# Patient Record
Sex: Male | Born: 1955 | Race: White | Hispanic: No | Marital: Married | Smoking: Never smoker
Health system: Southern US, Community
[De-identification: ages and names within clinical notes are randomized; demographics above are authoritative.]

## PROBLEM LIST (undated history)

## (undated) DIAGNOSIS — G43909 Migraine, unspecified, not intractable, without status migrainosus: Secondary | ICD-10-CM

## (undated) DIAGNOSIS — G44009 Cluster headache syndrome, unspecified, not intractable: Secondary | ICD-10-CM

## (undated) DIAGNOSIS — I10 Essential (primary) hypertension: Secondary | ICD-10-CM

## (undated) HISTORY — PX: BACK SURGERY: SHX140

---

## 1999-11-23 ENCOUNTER — Encounter: Admission: RE | Admit: 1999-11-23 | Discharge: 1999-11-23 | Payer: Self-pay | Admitting: Internal Medicine

## 1999-11-23 ENCOUNTER — Encounter: Payer: Self-pay | Admitting: Internal Medicine

## 2003-04-07 ENCOUNTER — Ambulatory Visit (HOSPITAL_COMMUNITY): Admission: AD | Admit: 2003-04-07 | Discharge: 2003-04-08 | Payer: Self-pay | Admitting: Otolaryngology

## 2003-05-21 ENCOUNTER — Ambulatory Visit (HOSPITAL_BASED_OUTPATIENT_CLINIC_OR_DEPARTMENT_OTHER): Admission: RE | Admit: 2003-05-21 | Discharge: 2003-05-21 | Payer: Self-pay | Admitting: Otolaryngology

## 2004-05-20 ENCOUNTER — Ambulatory Visit (HOSPITAL_COMMUNITY): Admission: RE | Admit: 2004-05-20 | Discharge: 2004-05-20 | Payer: Self-pay | Admitting: *Deleted

## 2004-05-20 ENCOUNTER — Encounter (INDEPENDENT_AMBULATORY_CARE_PROVIDER_SITE_OTHER): Payer: Self-pay | Admitting: Specialist

## 2004-08-23 ENCOUNTER — Encounter: Admission: RE | Admit: 2004-08-23 | Discharge: 2004-08-23 | Payer: Self-pay | Admitting: Internal Medicine

## 2007-04-29 ENCOUNTER — Encounter: Admission: RE | Admit: 2007-04-29 | Discharge: 2007-04-29 | Payer: Self-pay | Admitting: Internal Medicine

## 2007-05-23 ENCOUNTER — Encounter: Admission: RE | Admit: 2007-05-23 | Discharge: 2007-05-23 | Payer: Self-pay | Admitting: Family Medicine

## 2007-05-29 ENCOUNTER — Ambulatory Visit (HOSPITAL_COMMUNITY): Admission: RE | Admit: 2007-05-29 | Discharge: 2007-05-30 | Payer: Self-pay | Admitting: Neurosurgery

## 2007-12-05 ENCOUNTER — Inpatient Hospital Stay (HOSPITAL_COMMUNITY): Admission: RE | Admit: 2007-12-05 | Discharge: 2007-12-06 | Payer: Self-pay | Admitting: Neurosurgery

## 2010-11-06 ENCOUNTER — Encounter: Payer: Self-pay | Admitting: Internal Medicine

## 2011-02-28 NOTE — Op Note (Signed)
NAMEJACOREY, William Clark                ACCOUNT NO.:  [id number]   MEDICAL RECORD NO.:  [id number]          PATIENT TYPE:  AMB   LOCATION:  SDS                          FACILITY:  [facility]   PHYSICIAN:  Hewitt Shorts, M.D.DATE OF BIRTH:  Jan 22, 1956   DATE OF PROCEDURE:  05/29/2007  DATE OF DISCHARGE:                               OPERATIVE REPORT   PREOPERATIVE DIAGNOSIS:  C6-7 cervical disk herniations, cervical  spondylosis, cervical degenerative disk disease, and cervical  radiculopathy.   POSTOPERATIVE DIAGNOSIS:  C6-7 cervical disk herniations, cervical  spondylosis, cervical degenerative disk disease, and cervical  radiculopathy.   PROCEDURE:  C6-7 anterior cervical diskectomy and arthrodesis with  allograft and tether cervical plating.   SURGEON:  Hewitt Shorts, M.D.   ASSISTANTS:  Nelia Shi. Webb Silversmith, RN   ANESTHESIA:  General endotracheal.   INDICATION:  The patient is a 55 year old man who presented with left  cervical radiculopathy.  It is found to be due to left C6-7 spondylytic  disk herniation superimposed upon underlying spondylosis and  degenerative disk disease.  The decision was made to proceed with single  level anterior cervical diskectomy and arthrodesis.   PROCEDURE:  The patient was brought to the operating room, placed under  general endotracheal anesthesia.  The patient was placed in 10 pounds of  Holter traction.  The neck was prepped with Betadine soaping solution,  draped in a sterile fashion.  A horizontal incision was made in the left  side of the neck.  The line of the incision was infiltrated with local  anesthetic with epinephrine.  Dissection was carried down to the  subcutaneous tissue.  Bipolar cautery and electrocautery was used to  maintain hemostasis.  Dissection was then carried out to the platysma  and then through an avascular plane leaving the sternocleidomastoid,  carotid artery and jugular vein laterally and the trachea and  esophagus  medially.  The ventral aspect of the vertebral column was identified and  localizing x-ray was taken.  It was difficult to localize because of his  large shoulders and we had to count down from the C4-5 level identifying  C4-5, C5-6 and C6-7.  We then draped the microscope.  It was brought  into the field to provide additional magnification, illumination and  visualization, then decompression was performed using microdissection  and microsurgical technique.  There was large osteophytic overgrowth  that was removed using the XMax drill.  We entered into the disk space.  It was incised with a #15 scalpel.  The diskectomy was performed using a  variety of microcurets and pituitary rongeurs.  The cartilaginous end  plates of the corresponding vertebrae were removed using the XMax drill  and microcurets and then significant posterior osteophytic overgrowth  was removed using the XMax drill and a 2-mm Kerrison punch with a thin  foot plate.  The posterior longitudinal ligament was carefully removed  and we then turned our attention to the neuroforamina.  In the left C6-7  neuroforamina, we did find a free fragment disk herniation.  These  fragments were removed and we were able to  decompress the neuroforamen  bilaterally.  In the end, good decompression of the spinal canal and  thecal sac as well as the neuroforamina and exiting nerve roots was  achieved.  Hemostasis was established with the use of Gelfoam soaked in  thrombin.  We did remove all of the Gelfoam though prior to closure.  We  measured the height of the intravertebral disk space and selected a 7 mm  interbody allograft implant.  It was hydrated in saline solution and  positioned in the intravertebral disk space and countersunk.  We then  removed the cervical traction and selected a 14 mm tether cervical  plate.  It was secured to the vertebra with 4.0 mm variable angle  screws, placing 14 mm screws at C6 and 15 mm screws  at C7.  Each of the  screw holes was drilled and tapped and the screws were placed in an  alternating fashion.  Final tightening was performed of all 4 screws.  An x-ray was not performed because we could not visualize the C5-6  bedding at the C6-7 level with the x-rays due to his shoulders.  However, under direct visualization, the bone graft and plate and screws  all appeared in excellent position.  The wound was irrigated with  bacitracin solution, checked for hemostasis, which was established and  confirmed, and then we proceeded with closure.  The platysma was closed  with interrupted, inverted 2-0 undyed Vicryl sutures.  The subcutaneous  and subcuticular were closed with interrupted, inverted 3-0 undyed  Vicryl sutures and the skin was reapproximated with Dermabond.  The  procedure was tolerated well.  The estimated blood loss was 25 mL.  Sponge and needle count were correct.   Following surgery, the patient was placed in a soft cervical collar, to  be reversed from the anesthetic, extubated and transferred to the  recovery room for further care.      Hewitt Shorts, M.D.  Electronically Signed     RWN/MEDQ  D:  05/29/2007  T:  05/29/2007  Job:  [id number]

## 2011-02-28 NOTE — Op Note (Signed)
NAMETANNON, PEERSON NO.:  [id number]   MEDICAL RECORD NO.:  [id number]          PATIENT TYPE:  INP   LOCATION:  2899                         FACILITY:  [facility]   PHYSICIAN:  Hewitt Shorts, M.D.DATE OF BIRTH:  1956-08-26   DATE OF PROCEDURE:  12/05/2007  DATE OF DISCHARGE:                               OPERATIVE REPORT   PREOPERATIVE DIAGNOSES:  1. 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  2. Lumbar stenosis.  3. Lumbar spondylosis.  4. Lumbar degenerative disk disease.   POSTOPERATIVE DIAGNOSES:  1. 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  2. Lumbar stenosis.  3. Lumbar spondylosis.  4. Lumbar degenerative disk disease.   PROCEDURE:  Bilateral L4-5 lumbar laminotomy, facetectomy, foraminotomy  with decompression of the L4 and L5 nerve roots bilaterally with  decompression beyond that required for interbody fusion, bilateral L4-  L5, post lumbar antibody fusion with AVS PEEK  interbody implants and  mosaic with lumbar aspirate and bilateral L4 and L5 posterior  arthrodesis with radius  posterior instrumentation and mosaic with bone  marrow aspirate and microdissection.   SURGEON:  Molly Maduro AW. Underlain, MD.   ASSISTANT:  Angie Fava M.D.   ANESTHESIA:  General endotracheal.   INDICATIONS FOR PROCEDURE:  The patient is a 55 year old man who  presented with neurogenic claudication.  X-rays and MRI scan revealed a  dynamic general spondylolisthesis with significant canal stenosis and  the patient is wished to proceed with decompression and arthrodesis.   PROCEDURE:  The patient was brought to the operating room and placed  under general endotracheal anesthesia.  The patient was turned to the  prone position.  Lumbar region was prepped with Betadine soap and  solution and draped in sterile fashion.  The midline was infiltrated  with local anesthetic with epinephrine and then x-rays were taken.  The  L4-L5 level was identified  and midline incision made and carried down  through the subcutaneous tissue.  Bipolar cautery and electrocautery was  used to maintain hemostasis.  Dissection was carried down to the lumbar  fascia which was incised bilaterally, and paraspinal muscles were  dissected off the spinal process and lamina in a subperiosteal fashion.  The L4-L5 intralaminar space was identified and an x-ray was taken to  confirm the localization.  Then, we proceeded with the decompression  with magnification.  Using the X-Max drill and Kerrison punches a  bilateral laminotomy and facetectomy was performed.  There was marked  bony and ligament overgrowth with significant canal stenosis.  This was  carefully decompressed.  The decompression was extended laterally in the  neural foramen.  We identified the exiting L4 and L5 nerve roots  bilaterally.  A broad based spondylytic disk herniation was also noted  with significant disk protrusion across the posterior aspects of the  disk base.  We proceeded with bilateral L4 and L5 discectomy incising  the annulus and continued with a variety of micro curettes and pituitary  Rongeurs.  Osteophytic overgrowth from the posterior aspect of the  vertebral bodies was removed using an osteophyte removal tool and we  were able to decompress  the ventral aspect of thecal sac and in the end  good decompression was achieved to the thecal sac and nerve roots and  hemostasis was established.   We then went ahead to prepare the end-plates for the interbody  arthrodesis using a variety of paddle curettes.  The end plate surfaces  had the caudal end  surface removed down to a good bony surface.  We  measured the height of the interbody spacing, selected a 11 mm in height  implants and 11 mm in width implants.  There are 25 mm in depth and 4  degrees in lordosis.  We probed the right L5 pedicle , aspirated bone  marrow aspirate which was injected over 15 mL strip of mosaic.  We then   packed the implants with the mosaic and then carefully retracting the  thecal sac nerve root on the right side, we placed the first implant on  the right side.  It was countersunk.  We then went to the left side,  packed additional mosaic with bone marrow aspirate in the midline and  then placed a second implant on the left side, again carefully  retracting the thecal sac  and nerve root and again that implant was  countersunk as well.  We then draped the C-arm  fluoroscope and it was  brought into the field.   We proceeded with the posterolateral arthrodesis.  Pedicle entry sites  were identified bilaterally L4 as well as on the left side at L5.  Each  of the pedicles was probed.  Each was examined with a ball probe, no  cutoff was found and good bony surface was found.  We tapped the L4  pedicles with a 5.25 mm tap and the L5 pedicle with the 6.25 mm tap.  Again examined with a ball probe.  Good threading was noted.  No cutoffs  were found and then we placed 5.05 x 45 mm screws at L4 bilaterally and  6.75 x 45 mm screws bilaterally at L5.  Once all 4 screws were placed,  we took a look at the positioning via an AP trajectory as well.  It was  felt to be in good position, and then we selected 35 mm prelordosed rods  that were placed within the screw heads and secured with locking caps.  All 4 locking caps were then tightened against the counter torque.  We  previously exposed the transverse processes of L4 and L5 bilaterally.  They had been decorticated as had the remaining portions of the facets,  and we packed the remaining mosaic with bone marrow aspirate in the  lateral gutters over the transverse processes, into transverse base and  over the posterior aspect of the lateral portion of the facets.  Once  all the bone graft material had been placed, we again examined the  thecal sac and nerve roots that remained well decompressed.  The wound  had been irrigated numerous times through  the procedure initially with  saline solution and then with bacitracin solution.  Good hemostasis was  established and confirmed.  We then proceeded with closure.  The deep  fascia was closed with interrupted undyed 1Vicryl sutures, Scarpa's  fascia was closed with uninterrupted undyed 1Vicryl sutures and  subcutaneous subcuticular layers with uninterrupted undyed 2-0 Vicryl  sutures and skin edges were closed with surgical staples.  The wound was  dressed with Adaptic and sterile gauze.  The procedure was tolerated  well.  The estimated blood loss was 150  mL.  We did use a CellSaver  during the procedure but it was felt that there was insufficient blood  loss.  This has been done via the collected blood and no blood was  returned to the patient.  Sponge and needle count were correct.  Following surgery, the patient was turned back to the supine position to  be reversed from the anesthetic, extubated, and transferred to the  recovery room for further care.      Hewitt Shorts, M.D.  Electronically Signed     RWN/MEDQ  D:  12/05/2007  T:  12/06/2007  Job:  [id number]

## 2011-03-03 NOTE — Op Note (Signed)
NAME:  William Clark, William Clark                          ACCOUNT NO.:  [id number]   MEDICAL RECORD NO.:  [id number]                   PATIENT TYPE:  AMB   LOCATION:  DSC                                  FACILITY:  [facility]   PHYSICIAN:  Suzanna Obey, M.D.                    DATE OF BIRTH:  [birth year]   DATE OF PROCEDURE:  05/21/2003  DATE OF DISCHARGE:                                 OPERATIVE REPORT   PREOPERATIVE DIAGNOSIS:  Deviated septum and turbinate hypertrophy.   POSTOPERATIVE DIAGNOSIS:  Deviated septum and turbinate hypertrophy.   SURGICAL PROCEDURE:  Septoplasty and bilateral inferior turbinate cautery.   ANESTHESIA:  General endotracheal tube.   ESTIMATED BLOOD LOSS:  Less than 10 mL.   INDICATIONS FOR PROCEDURE:  This is a 55 year old who has had significant  nasal obstruction.  He has a severe deviation of his septum to the left with  complete nasal obstruction on the left side and obstruction on the right  side from his turbinate hypertrophy.  He has been refractory to medical  therapy.  He was informed of the risks and benefits of the procedure  including bleeding, infection, perforation, chronic crusting and drying,  change in external appearance of the nose, and risks of the anesthetic.  All  questions were answered and consent was obtained.   OPERATION:  The patient was taken to the operating room and placed in the  supine position.  After adequate general endotracheal anesthesia, was placed  in a supine position, prepped and draped in the usual sterile fashion.  The  oxymetazoline pledgets were placed and then the septum and inferior  turbinates were injected with 1% lidocaine with 1:100,000 epinephrine.  The  left hemitransfixion incision was performed raising a mucoperichondrial and  ostial flap.  The cartilage was severely deviated to the left and there was  cartilaginous overgrowth into the left side creating an inferior spur.  The  cartilage was divided about 1 cm  posterior to the caudal strut.  The  cartilage was still securely supported on the nasal spine.  It was dissected  slightly to sit more in the midline and towards the right side.  The  inferior spur of cartilage was removed with a Therapist, nutritional and then the  bony spur was removed with a 4 mm osteotome.  The deviated portion of the  posterior cartilage and bone were removed with the Jansen-Middleton forceps.  This corrected the septal deflection.  The caudal strut had good support.  The hemitransfixion incision was closed with interrupted 4-0 chromic and a  quilting stitch was placed to the septum multiple times securing it  inferiorly as well around the nasal spine.  The quilting stitch was also  placed to the septum.  The inferior turbinates were infractured and the  bipolar cautery was placed into each turbinate using endoscopic  visualization and then  cauterized.  They were both then outfractured and the nasopharynx was  suctioned out of all blood and debris.  The oral cavity and oropharynx was  suctioned out of all blood and debris.  The patient was awakened and brought  to the recovery room in stable condition.  Counts were correct.                                               Suzanna Obey, M.D.    Cordelia Pen  D:  05/21/2003  T:  05/21/2003  Job:  [id number]   cc:   Janae Bridgeman. Eloise Harman., M.D.  [address]  [place]  [place] [address]  Fax: [phone]

## 2011-03-03 NOTE — Op Note (Signed)
NAME:  William Clark, William Clark                          ACCOUNT NO.:  [id number]   MEDICAL RECORD NO.:  [id number]                   PATIENT TYPE:  AMB   LOCATION:  ENDO                                 FACILITY:  [facility]   PHYSICIAN:  Georgiana Spinner, M.D.                 DATE OF BIRTH:  10-11-1956   DATE OF PROCEDURE:  DATE OF DISCHARGE:                                 OPERATIVE REPORT   PROCEDURE:  Colonoscopy.   INDICATIONS FOR PROCEDURE:  Colon polyp.   ANESTHESIA:  Demerol 80, Versed 8 mg.   DESCRIPTION OF PROCEDURE:  With the patient mildly sedated in the left  lateral decubitus position, the Olympus videoscopic colonoscope was inserted  in the rectum and passed under direct vision to the cecum identified by the  ileocecal valve and appendiceal orifice. In the appendiceal orifice, the  tissue appeared to be glandular so this was photographed and biopsied. From  this point, the colonoscope was slowly withdrawn taking circumferential  views of the colonic mucosa stopping first in the hepatic flexure area where  a polyp was seen, photographed and removed using hot biopsy forceps  technique on a setting of 20:20 blended current. We next stopped at 20 cm  from the anal verge at which point there were three polyps seen and two were  removed using hot biopsy forceps technique, the third was removed using  snare cautery technique all with the same setting of 20:20 blended current.  The endoscope was then withdrawn all the way to the rectum which appeared  normal on direct and retroflexed view showed hemorrhoids.  The endoscope was  straightened and withdrawn. The patient's vital signs and pulse oximeter  remained stable. The patient tolerated the procedure well without apparent  complications.   FINDINGS:  Polyps as described above, internal hemorrhoids otherwise  unremarkable exam.  Await biopsy report. The patient will call me for  results and followup with me as an outpatient.                                       Georgiana Spinner, M.D.    GMO/MEDQ  D:  05/20/2004  T:  05/21/2004  Job:  [id number]

## 2011-03-03 NOTE — Op Note (Signed)
NAME:  PHI, AVANS NO.:  [id number]   MEDICAL RECORD NO.:  [id number]                   PATIENT TYPE:  OIB   LOCATION:  5733                                 FACILITY:  [facility]   PHYSICIAN:  Suzanna Obey, M.D.                    DATE OF BIRTH:  [birth year]   DATE OF PROCEDURE:  DATE OF DISCHARGE:                                 OPERATIVE REPORT   PREOPERATIVE DIAGNOSIS:  Right paratonsillar abscess.   POSTOPERATIVE DIAGNOSIS:  Right paratonsillar abscess.   SURGICAL PROCEDURE:  Incision and drainage of right paratonsillar abscess.   ANESTHESIA:  General endotracheal anesthesia.   SURGEON:  Suzanna Obey, M.D.   ESTIMATED BLOOD LOSS:  Approximately 25 mL.   INDICATIONS FOR PROCEDURE:  This is a 55 year old who has had about a two  day history of rapidly increasing pain and trismus.  He has had difficulty  getting anything down including food or water.  He had some type of dental  procedure about the first part of June which resulted in some sore throat  and he was treated with Augmentin.  He has now been completed with this for  a week or so and now he has developed these increasing symptoms.  It  definitely has the appearance of a right paratonsillar abscess.  The patient  was informed of the risks and benefits of the  procedure including bleeding,  infection, scarring, possible need for further surgery, chronic pain and  risks of the anesthetic.  All questions were answered and consent was  obtained.   OPERATION:  The patient was taken to the operating room and placed in the  supine position after adequate general endotracheal tube anesthesia.  He was  placed in the supine position and then in the [place] position and then a Crowe-  Davis mouth gag was inserted, retracted and suspended from the [employer].  It appeared that through the intubation process or placement of the CroweEarlene Plater that the tonsil area had spontaneously drained as there was  purulent  material laying in the pharynx that was suctioned out.  It had come from the  superior pole of the tonsil.  An incision was made just above this in the  paratonsillar space area and a curved hemostat was placed into the area and  there was no significant purulent material in this space.  There was some  swelling around the posterior molar on the right side and a small incision  was made just to make sure this was not some type of dental extension and  there was no purulent material in this area of swelling either.  The teeth  looked good and no evidence of any obvious problem with that.  There was  swelling that extended down just below the tonsil on the right side.  Both  wounds were  copiously irrigated with saline.  There was good hemostasis.  The  hypopharynx, esophagus and stomach were suctioned with the NG tube.  The  Crowe-Davis was removed. The patient was awakened and brought to the  recovery room in stable condition.  Counts were correct.                                               Suzanna Obey, M.D.    Cordelia Pen  D:  04/07/2003  T:  04/08/2003  Job:  [id number]   cc:   Janae Bridgeman. Eloise Harman., M.D.  [address]  [place]  [place] [address]  Fax: [phone]

## 2011-07-07 LAB — CBC
HCT: 43.6
Hemoglobin: 15.1
MCHC: 34.6
MCV: 88.9
Platelets: 218
RBC: 4.91
RDW: 13.3
WBC: 6.6

## 2011-07-07 LAB — TYPE AND SCREEN
ABO/RH(D): O POS
Antibody Screen: NEGATIVE

## 2011-07-07 LAB — ABO/RH: ABO/RH(D): O POS

## 2011-07-31 LAB — BASIC METABOLIC PANEL
BUN: 14
CO2: 28
Calcium: 9.4
Chloride: 105
Creatinine, Ser: 0.73
GFR calc Af Amer: >60
GFR calc non Af Amer: >60
Glucose, Bld: 99
Potassium: 4.1
Sodium: 138

## 2011-07-31 LAB — CBC
HCT: 39.7
Hemoglobin: 14.1
MCHC: 35.5
MCV: 88.3
Platelets: 232
RBC: 4.49
RDW: 13.6
WBC: 5.8

## 2011-08-24 ENCOUNTER — Encounter: Payer: Self-pay | Admitting: Gastroenterology

## 2015-10-28 ENCOUNTER — Encounter (HOSPITAL_COMMUNITY): Payer: Self-pay | Admitting: Nurse Practitioner

## 2015-10-28 ENCOUNTER — Emergency Department (HOSPITAL_COMMUNITY)
Admission: EM | Admit: 2015-10-28 | Discharge: 2015-10-28 | Disposition: A | Discharge status: Home / Self Care | Payer: BLUE CROSS/BLUE SHIELD | Attending: Emergency Medicine | Admitting: Emergency Medicine

## 2015-10-28 DIAGNOSIS — R519 Headache, unspecified: Secondary | ICD-10-CM

## 2015-10-28 DIAGNOSIS — I10 Essential (primary) hypertension: Secondary | ICD-10-CM | POA: Diagnosis not present

## 2015-10-28 DIAGNOSIS — G43909 Migraine, unspecified, not intractable, without status migrainosus: Secondary | ICD-10-CM | POA: Diagnosis not present

## 2015-10-28 DIAGNOSIS — R51 Headache: Secondary | ICD-10-CM | POA: Diagnosis present

## 2015-10-28 HISTORY — DX: Migraine, unspecified, not intractable, without status migrainosus: G43.909

## 2015-10-28 HISTORY — DX: Essential (primary) hypertension: I10

## 2015-10-28 HISTORY — DX: Cluster headache syndrome, unspecified, not intractable: G44.009

## 2015-10-28 MED ORDER — METOCLOPRAMIDE HCL 5 MG/ML IJ SOLN
10.0000 mg | Freq: Once | INTRAMUSCULAR | Status: AC
  Administered 2015-10-28: 10 mg via INTRAVENOUS
  Filled 2015-10-28: qty 2

## 2015-10-28 MED ORDER — SODIUM CHLORIDE 0.9 % IV BOLUS (SEPSIS)
1000.0000 mL | Freq: Once | INTRAVENOUS | Status: AC
  Administered 2015-10-28: 1000 mL via INTRAVENOUS

## 2015-10-28 MED ORDER — DIPHENHYDRAMINE HCL 50 MG/ML IJ SOLN
25.0000 mg | Freq: Once | INTRAMUSCULAR | Status: AC
  Administered 2015-10-28: 25 mg via INTRAVENOUS
  Filled 2015-10-28: qty 1

## 2015-10-28 NOTE — Discharge Instructions (Signed)
Migraine Headache A migraine headache is an intense, throbbing pain on one or both sides of your head. A migraine can last for 30 minutes to several hours. CAUSES  The exact cause of a migraine headache is not always known. However, a migraine may be caused when nerves in the brain become irritated and release chemicals that cause inflammation. This causes pain. Certain things may also trigger migraines, such as:  Alcohol.  Smoking.  Stress.  Menstruation.  Aged cheeses.  Foods or drinks that contain nitrates, glutamate, aspartame, or tyramine.  Lack of sleep.  Chocolate.  Caffeine.  Hunger.  Physical exertion.  Fatigue.  Medicines used to treat chest pain (nitroglycerine), birth control pills, estrogen, and some blood pressure medicines. SIGNS AND SYMPTOMS  Pain on one or both sides of your head.  Pulsating or throbbing pain.  Severe pain that prevents daily activities.  Pain that is aggravated by any physical activity.  Nausea, vomiting, or both.  Dizziness.  Pain with exposure to bright lights, loud noises, or activity.  General sensitivity to bright lights, loud noises, or smells. Before you get a migraine, you may get warning signs that a migraine is coming (aura). An aura may include:  Seeing flashing lights.  Seeing bright spots, halos, or zigzag lines.  Having tunnel vision or blurred vision.  Having feelings of numbness or tingling.  Having trouble talking.  Having muscle weakness. DIAGNOSIS  A migraine headache is often diagnosed based on:  Symptoms.  Physical exam.  A CT scan or MRI of your head. These imaging tests cannot diagnose migraines, but they can help rule out other causes of headaches. TREATMENT Medicines may be given for pain and nausea. Medicines can also be given to help prevent recurrent migraines.  HOME CARE INSTRUCTIONS  Only take over-the-counter or prescription medicines for pain or discomfort as directed by your  health care provider. The use of long-term narcotics is not recommended.  Lie down in a dark, quiet room when you have a migraine.  Keep a journal to find out what may trigger your migraine headaches. For example, write down:  What you eat and drink.  How much sleep you get.  Any change to your diet or medicines.  Limit alcohol consumption.  Quit smoking if you smoke.  Get 7-9 hours of sleep, or as recommended by your health care provider.  Limit stress.  Keep lights dim if bright lights bother you and make your migraines worse. SEEK IMMEDIATE MEDICAL CARE IF:   Your migraine becomes severe.  You have a fever.  You have a stiff neck.  You have vision loss.  You have muscular weakness or loss of muscle control.  You start losing your balance or have trouble walking.  You feel faint or pass out.  You have severe symptoms that are different from your first symptoms. MAKE SURE YOU:   Understand these instructions.  Will watch your condition.  Will get help right away if you are not doing well or get worse.   This information is not intended to replace advice given to you by your health care provider. Make sure you discuss any questions you have with your health care provider.   Document Released: 10/02/2005 Document Revised: 10/23/2014 Document Reviewed: 06/09/2013 Elsevier Interactive Patient Education 2016 [employer]  General Headache Without Cause A headache is pain or discomfort felt around the head or neck area. The specific cause of a headache may not be found. There are many causes and types of  headaches. A few common ones are:  Tension headaches.  Migraine headaches.  Cluster headaches.  Chronic daily headaches. HOME CARE INSTRUCTIONS  Watch your condition for any changes. Take these steps to help with your condition: Managing Pain  Take over-the-counter and prescription medicines only as told by your health care provider.  Lie down in a  dark, quiet room when you have a headache.  If directed, apply ice to the head and neck area:  Put ice in a plastic bag.  Place a towel between your skin and the bag.  Leave the ice on for 20 minutes, 2-3 times per day.  Use a heating pad or hot shower to apply heat to the head and neck area as told by your health care provider.  Keep lights dim if bright lights bother you or make your headaches worse. Eating and Drinking  Eat meals on a regular schedule.  Limit alcohol use.  Decrease the amount of caffeine you drink, or stop drinking caffeine. General Instructions  Keep all follow-up visits as told by your health care provider. This is important.  Keep a headache journal to help find out what may trigger your headaches. For example, write down:  What you eat and drink.  How much sleep you get.  Any change to your diet or medicines.  Try massage or other relaxation techniques.  Limit stress.  Sit up straight, and do not tense your muscles.  Do not use tobacco products, including cigarettes, chewing tobacco, or e-cigarettes. If you need help quitting, ask your health care provider.  Exercise regularly as told by your health care provider.  Sleep on a regular schedule. Get 7-9 hours of sleep, or the amount recommended by your health care provider. SEEK MEDICAL CARE IF:   Your symptoms are not helped by medicine.  You have a headache that is different from the usual headache.  You have nausea or you vomit.  You have a fever. SEEK IMMEDIATE MEDICAL CARE IF:   Your headache becomes severe.  You have repeated vomiting.  You have a stiff neck.  You have a loss of vision.  You have problems with speech.  You have pain in the eye or ear.  You have muscular weakness or loss of muscle control.  You lose your balance or have trouble walking.  You feel faint or pass out.  You have confusion.   This information is not intended to replace advice given to you  by your health care provider. Make sure you discuss any questions you have with your health care provider.   Document Released: 10/02/2005 Document Revised: 06/23/2015 Document Reviewed: 01/25/2015 Elsevier Interactive Patient Education [employer] Inc[employer]

## 2015-10-28 NOTE — ED Provider Notes (Signed)
CSN: [id number]     Arrival date & time 10/28/15  1053 History   First MD Initiated Contact with Patient 10/28/15 1527     Chief Complaint  Patient presents with  . Headache   William Clark is a 60 y.o. male with a history of hypertension, migraine headaches, and cluster headaches who presents to the emergency department complaining of a headache ongoing for the past 4 days. Patient complains of a posterior headache that wraps around to the front of his head. He currently complains of 5 out of 10 pain. He reports his headache is starting to improve after Maxalt.  Patient reports his pain is slightly better after a hot shower. He reports he's been having similar headaches for the past year. He is not currently seeing a neurologist. The patient also reports white noise sound intermittently in his ears. Patient denies fevers, chills, neck stiffness, abdominal pain, nausea, vomiting, diarrhea, double vision, blurry vision, numbness, tingling, weakness, head injury, lightheadedness, dizziness, or urinary symptoms.  (Consider location/radiation/quality/duration/timing/severity/associated sxs/prior Treatment) HPI  Past Medical History  Diagnosis Date  . Hypertension   . Migraine   . Headache, cluster    Past Surgical History  Procedure Laterality Date  . Back surgery     History reviewed. No pertinent family history. Social History  Substance Use Topics  . Smoking status: Never Smoker   . Smokeless tobacco: None  . Alcohol Use: No    Review of Systems  Constitutional: Negative for fever and chills.  HENT: Negative for congestion, ear pain and sore throat.   Eyes: Positive for photophobia. Negative for pain, discharge, redness and visual disturbance.  Respiratory: Negative for cough, shortness of breath and wheezing.   Cardiovascular: Negative for chest pain and palpitations.  Gastrointestinal: Negative for nausea, vomiting, abdominal pain and diarrhea.  Genitourinary: Negative for  dysuria and difficulty urinating.  Musculoskeletal: Negative for back pain, neck pain and neck stiffness.  Skin: Negative for rash.  Neurological: Positive for headaches. Negative for dizziness, syncope, speech difficulty, weakness, light-headedness and numbness.      Allergies  Codeine  Home Medications   Prior to Admission medications   Medication Sig Start Date End Date Taking? Authorizing Provider  diazepam (VALIUM) 5 MG tablet Take 5 mg by mouth every 6 (six) hours as needed for muscle spasms.   Yes Historical Provider, MD  rizatriptan (MAXALT-MLT) 10 MG disintegrating tablet Take 5 mg by mouth as needed for migraine. May repeat in 2 hours if needed   Yes Historical Provider, MD  traMADol (ULTRAM) 50 MG tablet Take 50 mg by mouth every 6 (six) hours as needed for moderate pain.   Yes Historical Provider, MD   BP 130/83 mmHg  Pulse 66  Temp(Src) 98.4 F (36.9 C) (Oral)  Resp 20  Ht 6' (1.829 m)  Wt 102.144 kg  BMI 30.53 kg/m2  SpO2 98% Physical Exam  Constitutional: He is oriented to person, place, and time. He appears well-developed and well-nourished. No distress.  HENT:  Head: Normocephalic and atraumatic.  Right Ear: External ear normal.  Left Ear: External ear normal.  Mouth/Throat: Oropharynx is clear and moist. No oropharyngeal exudate.  Bilateral tympanic membranes are pearly-gray without erythema or loss of landmarks.   no temporal edema or tenderness.  Eyes: Conjunctivae and EOM are normal. Pupils are equal, round, and reactive to light. Right eye exhibits no discharge. Left eye exhibits no discharge.   EOMs are intact. Vision is grossly intact.  Neck: Normal range of  motion. Neck supple. No JVD present. No tracheal deviation present.   Good normal range of motion of his neck. No meningeal signs.  Cardiovascular: Normal rate, regular rhythm, normal heart sounds and intact distal pulses.  Exam reveals no gallop and no friction rub.   No murmur  heard. Pulmonary/Chest: Effort normal and breath sounds normal. No respiratory distress. He has no wheezes. He has no rales.  Abdominal: Soft. Bowel sounds are normal. He exhibits no distension. There is no tenderness. There is no guarding.  Musculoskeletal: He exhibits no edema.  Lymphadenopathy:    He has no cervical adenopathy.  Neurological: He is alert and oriented to person, place, and time. No cranial nerve deficit. Coordination normal.   The patient is alert and oriented 3. Cranial nerves are intact. Speech is clear and coherent. No pronator drift. Finger to nose intact bilaterally. Vision is grossly intact. EOMs intact. Normal gait.  Skin: Skin is warm and dry. No rash noted. He is not diaphoretic. No erythema. No pallor.  Psychiatric: He has a normal mood and affect. His behavior is normal.  Nursing note and vitals reviewed.   ED Course  Procedures (including critical care time) Labs Review Labs Reviewed - No data to display  Imaging Review No results found.    EKG Interpretation None      Filed Vitals:   10/28/15 1700 10/28/15 1730 10/28/15 1800 10/28/15 1815  BP: 134/87 122/93 127/92 130/83  Pulse: 66 52 55 66  Temp:      TempSrc:      Resp:      Height:      Weight:      SpO2: 94% 98% 99% 98%     MDM   Meds given in ED:  Medications  sodium chloride 0.9 % bolus 1,000 mL (1,000 mLs Intravenous New Bag/Given 10/28/15 1639)  metoCLOPramide (REGLAN) injection 10 mg (10 mg Intravenous Given 10/28/15 1635)  diphenhydrAMINE (BENADRYL) injection 25 mg (25 mg Intravenous Given 10/28/15 1638)    New Prescriptions   No medications on file    Final diagnoses:  Bad headache   This  is a 60 y.o. male with a history of hypertension, migraine headaches, and cluster headaches who presents to the emergency department complaining of a headache ongoing for the past 4 days. Patient complains of a posterior headache that wraps around to the front of his head. He currently  complains of 5 out of 10 pain. He reports his headache is starting to improve after Maxalt. Patient's headache treated and resolved while in the ED.  Presentation is not concerning for [facility], ICH, Meningitis, or temporal arteritis. Pt is afebrile with no focal neuro deficits, nuchal rigidity, or change in vision. Pt is to follow up with PCP to discuss prophylactic medication. I advised the patient to follow-up with their primary care provider this week. I advised the patient to return to the emergency department with new or worsening symptoms or new concerns. The patient verbalized understanding and agreement with plan.        Everlene Farrier, PA-C 10/28/15 1821  Mancel Bale, MD 10/29/15 1302

## 2015-10-28 NOTE — ED Notes (Signed)
Pt is in stable condition upon d/c and ambulates from ED. 

## 2015-10-28 NOTE — ED Notes (Signed)
He c/o 3-4 day hx headaches and tinnitus. Describes pain as a constant pressure. He reports hx migraines and cluster headaches but these headaches feel different. He reports n/v last night with the headache. He has tried aleve, tylenol, ibuprofen, tramadol, back and body with no relief of the pain and tramadol increased the pain. He is A&Ox4, resp e/u. He has had a hx intermittent HTN which he is currently off medications for because it was well controlled.

## 2015-11-09 DIAGNOSIS — G4733 Obstructive sleep apnea (adult) (pediatric): Secondary | ICD-10-CM | POA: Insufficient documentation

## 2015-11-11 ENCOUNTER — Other Ambulatory Visit: Payer: Self-pay | Admitting: Neurology

## 2015-11-11 DIAGNOSIS — G43719 Chronic migraine without aura, intractable, without status migrainosus: Secondary | ICD-10-CM

## 2015-11-30 ENCOUNTER — Ambulatory Visit: Payer: BLUE CROSS/BLUE SHIELD

## 2015-12-08 ENCOUNTER — Ambulatory Visit
Admission: RE | Admit: 2015-12-08 | Discharge: 2015-12-08 | Disposition: A | Discharge status: Home / Self Care | Payer: BLUE CROSS/BLUE SHIELD | Source: Ambulatory Visit | Attending: Neurology | Admitting: Neurology

## 2015-12-08 DIAGNOSIS — M5481 Occipital neuralgia: Secondary | ICD-10-CM | POA: Diagnosis present

## 2015-12-08 DIAGNOSIS — R51 Headache: Secondary | ICD-10-CM | POA: Diagnosis present

## 2015-12-08 DIAGNOSIS — G43719 Chronic migraine without aura, intractable, without status migrainosus: Secondary | ICD-10-CM

## 2015-12-08 MED ORDER — GADOBENATE DIMEGLUMINE 529 MG/ML IV SOLN
20.0000 mL | Freq: Once | INTRAVENOUS | Status: AC | PRN
  Administered 2015-12-08: 20 mL via INTRAVENOUS

## 2015-12-17 ENCOUNTER — Ambulatory Visit: Payer: BLUE CROSS/BLUE SHIELD

## 2016-05-11 IMAGING — MR MR HEAD WO/W CM
12 series · 48 of 48 positions shown · IV contrast (20 ML MULTIHANCE)
Comparison: None.

CLINICAL DATA: Posterior headache with intense pressure for 2
weeks.

EXAM:
MRI HEAD WITHOUT AND WITH CONTRAST
TECHNIQUE: Multiplanar, multiecho pulse sequences of the brain and surrounding
structures were obtained without and with intravenous contrast.
CONTRAST:  20mL MULTIHANCE GADOBENATE DIMEGLUMINE 529 MG/ML IV SOLN

[Series 2: T1 · sagittal · 5.0mm · 0.45mm/px · 1 of 23 slices shown (1 of 2)]
[im 1/23]
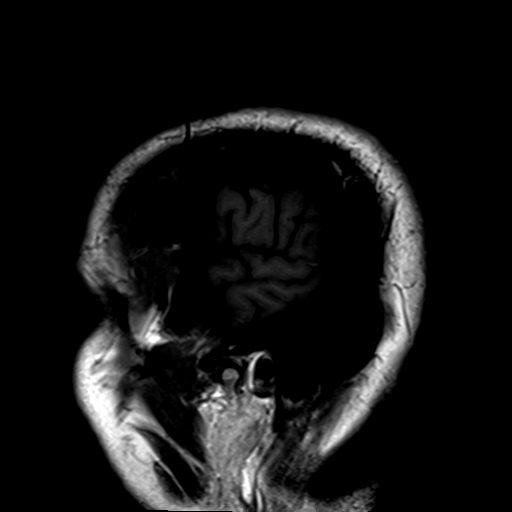

[Series 7: T2 · axial · 5.0mm · 0.60mm/px · z∈[-63,+106]mm · 2 of 27 slices shown (1 of 2)]
[im 1/27]
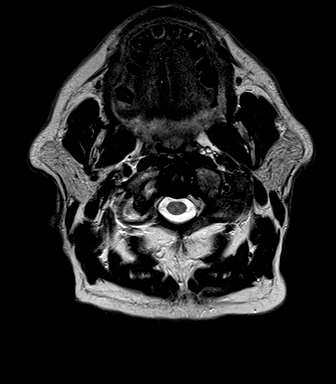
[im 27/27]
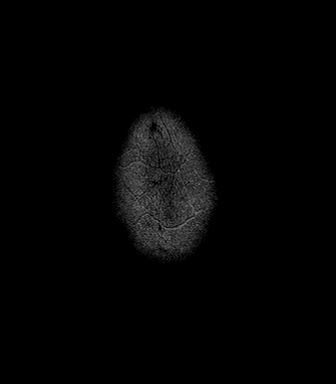

[Series 8: FLAIR · axial · 5.0mm · 0.45mm/px · z∈[-63,+106]mm · 3 of 27 slices shown]
[im 1/27]
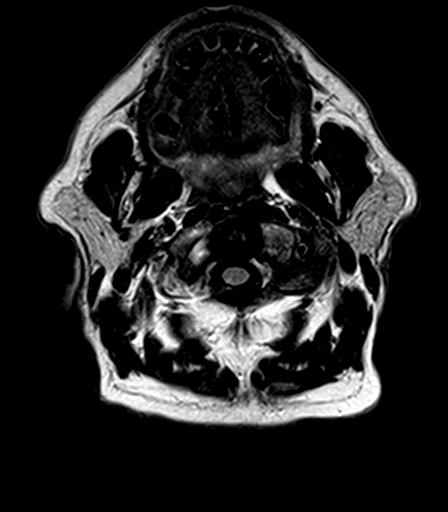
[im 14/27]
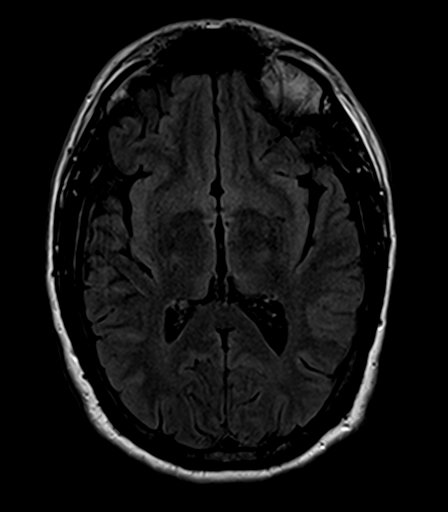
[im 27/27]
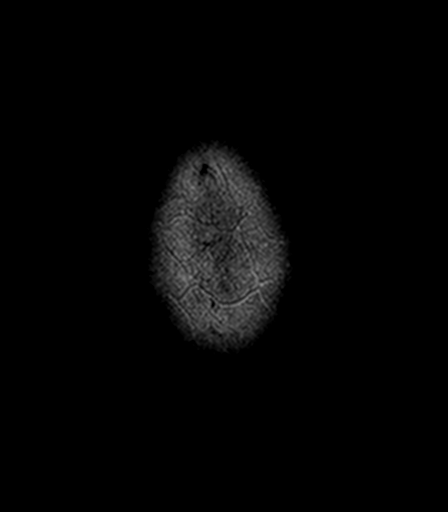

[Series 9: T2 · axial · 5.0mm · 0.45mm/px · z∈[-63,+106]mm · 3 of 27 slices shown (2 of 2)]
[im 1/27]
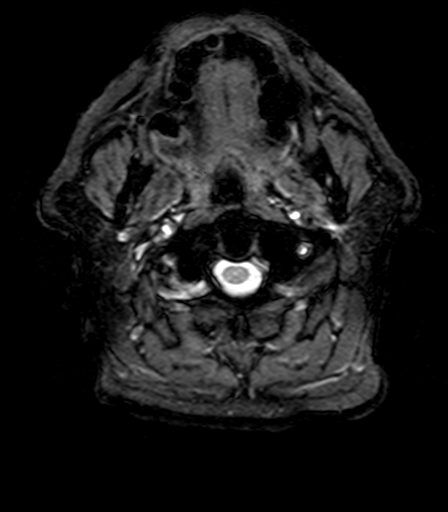
[im 14/27]
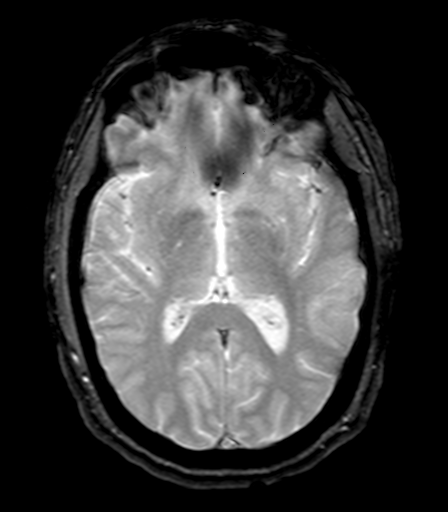
[im 27/27]
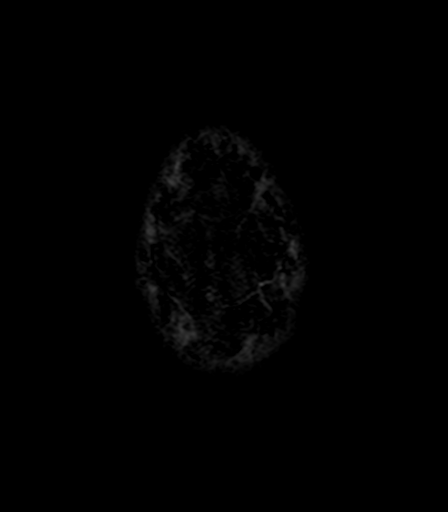

[Series 10: T1 · axial · 3.0mm · 1.00mm/px · z∈[-67,+110]mm · 6 of 60 slices shown (2 of 2)]
[im 1/60]
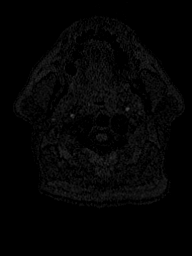
[im 12/60]
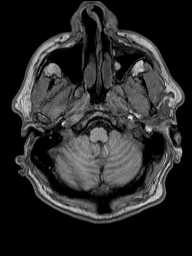
[im 24/60]
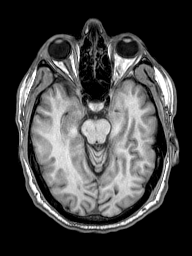
[im 36/60]
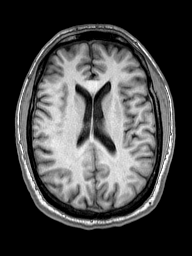
[im 48/60]
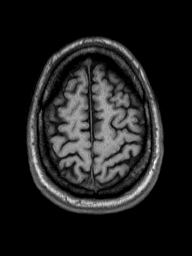
[im 60/60]
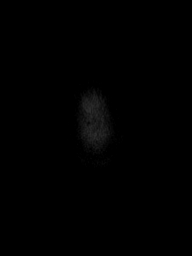

[Series 11: T2 post-contrast · coronal · 5.0mm · 0.49mm/px · 3 of 29 slices shown]
[im 1/29]
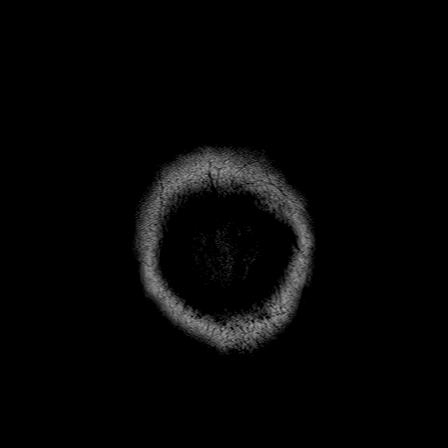
[im 15/29]
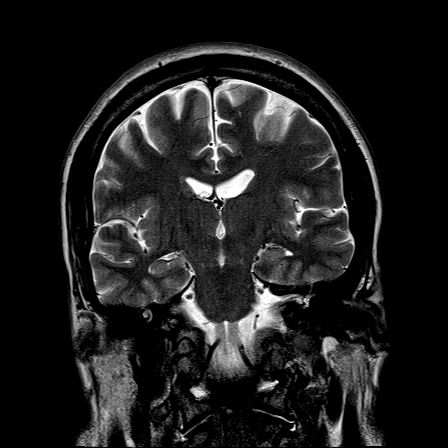
[im 29/29]
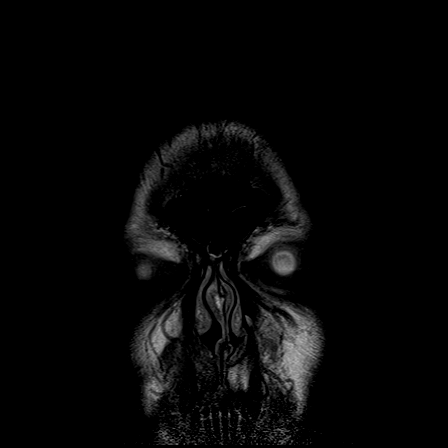

[Series 12: T1 post-contrast · axial · 3.0mm · 1.00mm/px · z∈[-67,+110]mm · 6 of 60 slices shown (1 of 2)]
[im 1/60]
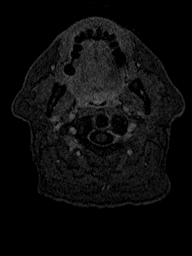
[im 12/60]
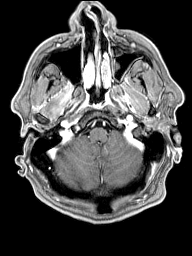
[im 24/60]
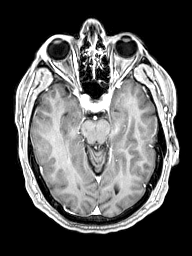
[im 36/60]
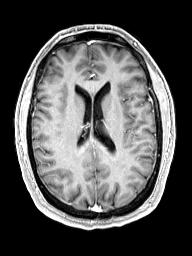
[im 48/60]
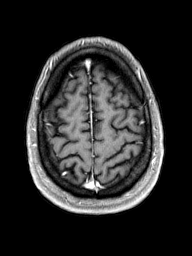
[im 60/60]
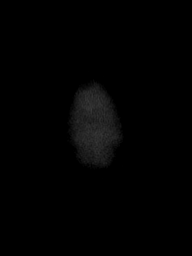

[Series 13: T1 post-contrast · coronal · 5.0mm · 0.43mm/px · 3 of 29 slices shown (2 of 2)]
[im 1/29]
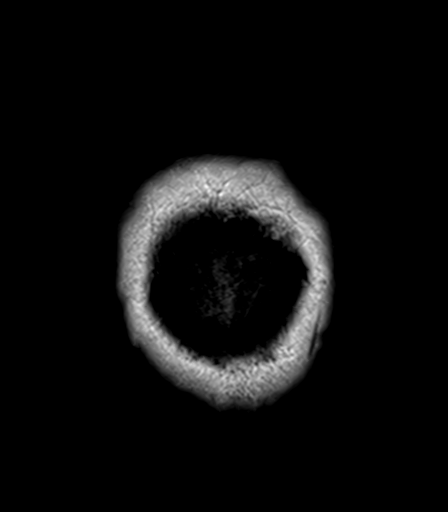
[im 15/29]
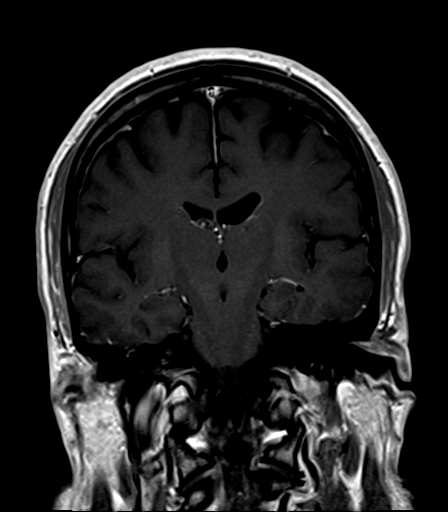
[im 29/29]
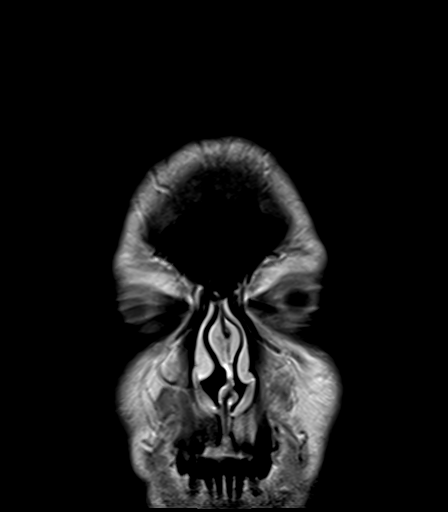

[Series 100: ADC · axial · 3.0mm · 1.80mm/px · z∈[-64,+107]mm · 5 of 56 slices shown (1 of 2)]
[im 1/56]
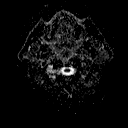
[im 14/56]
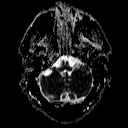
[im 28/56]
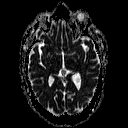
[im 42/56]
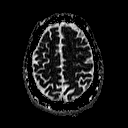
[im 56/56]
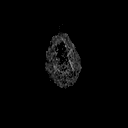

[Series 101: DWI · coronal · 3.0mm · 1.80mm/px · 5 of 47 slices shown (1 of 2)]
[im 1/47]
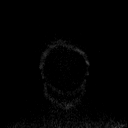
[im 12/47]
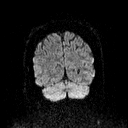
[im 24/47]
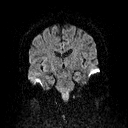
[im 35/47]
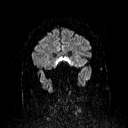
[im 47/47]
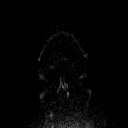

[Series 102: ADC · coronal · 3.0mm · 1.80mm/px · 5 of 48 slices shown (2 of 2)]
[im 1/48]
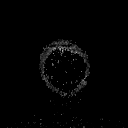
[im 12/48]
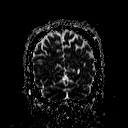
[im 24/48]
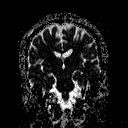
[im 36/48]
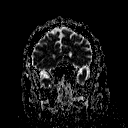
[im 48/48]
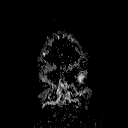

[Series 103: DWI · axial · 3.0mm · 1.80mm/px · z∈[-64,+107]mm · 6 of 58 slices shown (2 of 2)]
[im 1/58]
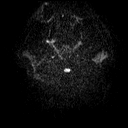
[im 12/58]
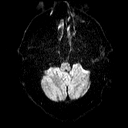
[im 23/58]
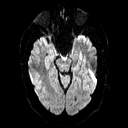
[im 35/58]
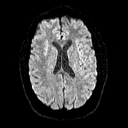
[im 46/58]
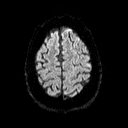
[im 58/58]
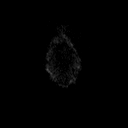

[48 of 48 positions shown; findings below may reference images not displayed]

FINDINGS: There is no evidence of acute infarct, intracranial hemorrhage,
mass, midline shift, or extra-axial fluid collection. Ventricles and
sulci are normal for age. The brain is normal in signal. A tiny
developmental venous anomaly is noted in the anterior parasagittal
right frontal lobe. No abnormal enhancement is seen elsewhere.

Orbits are unremarkable. Mild paranasal sinus mucosal thickening is
noted, greatest in the left maxillary sinus. The mastoid air cells
are clear. Major intracranial vascular flow voids are preserved.
IMPRESSION: No acute or significant intracranial abnormality.

## 2019-08-06 DIAGNOSIS — M150 Primary generalized (osteo)arthritis: Secondary | ICD-10-CM | POA: Insufficient documentation

## 2019-08-06 DIAGNOSIS — I10 Essential (primary) hypertension: Secondary | ICD-10-CM | POA: Insufficient documentation

## 2019-08-08 DIAGNOSIS — E781 Pure hyperglyceridemia: Secondary | ICD-10-CM | POA: Insufficient documentation

## 2021-03-20 LAB — EXTERNAL GENERIC LAB PROCEDURE: COLOGUARD: NEGATIVE

## 2021-03-20 LAB — COLOGUARD: COLOGUARD: NEGATIVE

## 2023-08-13 ENCOUNTER — Ambulatory Visit (INDEPENDENT_AMBULATORY_CARE_PROVIDER_SITE_OTHER): Payer: Self-pay | Admitting: Internal Medicine

## 2023-08-13 VITALS — BP 163/111 | HR 66 | Resp 16 | Ht 72.0 in | Wt 230.0 lb

## 2023-08-13 DIAGNOSIS — Z7189 Other specified counseling: Secondary | ICD-10-CM | POA: Diagnosis not present

## 2023-08-13 DIAGNOSIS — G4733 Obstructive sleep apnea (adult) (pediatric): Secondary | ICD-10-CM

## 2023-08-13 NOTE — Progress Notes (Unsigned)
Sleep Medicine   Office Visit  Patient Name: William Clark DOB: 1956-07-28 MRN [id number]    Chief Complaint: OSA   Brief History:  William Clark presents for an initial consult for sleep evaluation and to establish care. The patient has a 6 month history of sleep apnea and is currently on a CPAP. Sleep quality is good. This is noted most nights. Prior to using a PAP, the patient's bed partner/ family reported the following symptoms:  snoring at night. The patient relates the following symptoms currently: No symptoms with PAP. He is having difficulty tolerating the pressure and feels like it is too high sometimes.  The patient goes to sleep at 12-2 am and wakes up at 7 am. The patient reports a history of psychiatric problems (anxiety)  The Epworth Sleepiness Score is 6 out of 24 . The patient's STOP-BANG score is 4. The patient relates  Cardiovascular risk factors include: hypertension. . The patient is currently on a PAP@ 5-20 cmH2O. The patient reports using her/his APAP and feels rested after sleeping with PAP.  The patient reports benefiting from PAP use and would like for her/him to continue using PAP. Reported sleepiness is improved. The compliance download shows  80% compliance with an average use time of 5 hours 35 minutes. The AHI is 2.2.  The patient continues to require PAP therapy as a medical necessity in order to eliminate his/her sleep apnea.    ROS  General: (-) fever, (-) chills, (-) night sweat Nose and Sinuses: (-) nasal stuffiness or itchiness, (-) postnasal drip, (-) nosebleeds, (-) sinus trouble. Mouth and Throat: (-) sore throat, (-) hoarseness. Neck: (-) swollen glands, (-) enlarged thyroid, (-) neck pain. Respiratory: - cough, - shortness of breath, - wheezing. Neurologic: - numbness, - tingling. Psychiatric: + anxiety, - depression Sleep behavior: -sleep paralysis -hypnogogic hallucinations -dream enactment      -vivid dreams -cataplexy -night terrors -sleep  walking   Current Medication: Outpatient Encounter Medications as of 08/13/2023  Medication Sig   losartan (COZAAR) 50 MG tablet Take 1 tablet by mouth daily.   diazepam (VALIUM) 5 MG tablet Take 5 mg by mouth every 6 (six) hours as needed for muscle spasms.   rizatriptan (MAXALT-MLT) 10 MG disintegrating tablet Take 5 mg by mouth as needed for migraine. May repeat in 2 hours if needed   traMADol (ULTRAM) 50 MG tablet Take 50 mg by mouth every 6 (six) hours as needed for moderate pain.   No facility-administered encounter medications on file as of 08/13/2023.    Surgical History: Past Surgical History:  Procedure Laterality Date   BACK SURGERY      Medical History: Past Medical History:  Diagnosis Date   Headache, cluster    Hypertension    Migraine     Family History: Non contributory to the present illness  Social History: Social History   Socioeconomic History   Marital status: Married    Spouse name: Not on file   Number of children: Not on file   Years of education: Not on file   Highest education level: Not on file  Occupational History   Not on file  Tobacco Use   Smoking status: Never   Smokeless tobacco: Never  Substance and Sexual Activity   Alcohol use: No   Drug use: No   Sexual activity: Not on file  Other Topics Concern   Not on file  Social History Narrative   Not on file   Social Determinants of Health  Financial Resource Strain: Not on file  Food Insecurity: Not on file  Transportation Needs: Not on file  Physical Activity: Not on file  Stress: Not on file  Social Connections: Not on file  Intimate Partner Violence: Not on file    Vital Signs: Blood pressure (!) 163/111, pulse 66, resp. rate 16, height 6' (1.829 m), weight 230 lb (104.3 kg), SpO2 98%. Body mass index is 31.19 kg/m.   Examination: General Appearance: The patient is well-developed, well-nourished, and in no distress. Neck Circumference: 45 cm Skin: Gross  inspection of skin unremarkable. Head: normocephalic, no gross deformities. Eyes: no gross deformities noted. ENT: ears appear grossly normal Neurologic: Alert and oriented. No involuntary movements.    STOP BANG RISK ASSESSMENT S (snore) Have you been told that you snore?     NO   T (tired) Are you often tired, fatigued, or sleepy during the day?   NO  O (obstruction) Do you stop breathing, choke, or gasp during sleep? NO   P (pressure) Do you have or are you being treated for high blood pressure? YES   B (BMI) Is your body index greater than 35 kg/m? NO   A (age) Are you 52 years old or older? YES   N (neck) Do you have a neck circumference greater than 16 inches?   YES   G (gender) Are you a male? YES   TOTAL STOP/BANG "YES" ANSWERS 4                                                               A STOP-Bang score of 2 or less is considered low risk, and a score of 5 or more is high risk for having either moderate or severe OSA. For people who score 3 or 4, doctors may need to perform further assessment to determine how likely they are to have OSA.         EPWORTH SLEEPINESS SCALE:  Scale:  (0)= no chance of dozing; (1)= slight chance of dozing; (2)= moderate chance of dozing; (3)= high chance of dozing  Chance  Situtation    Sitting and reading: 0    Watching TV: 2    Sitting Inactive in public: 0    As a passenger in car: 2      Lying down to rest: 2    Sitting and talking: 0    Sitting quielty after lunch: 0    In a car, stopped in traffic: 0   TOTAL SCORE:   6 out of 24   CPAP COMPLIANCE DATA:  Date Range: 06/14/23 - 07/13/23  Average Daily Use: 5 hours 35 minutes  Median Use: 5 hours 51 minutes  Compliance for > 4 Hours: 80 %  AHI: 2.2 respiratory events per hour  Days Used: 30/30 days  Mask Leak: 12.6  95th Percentile Pressure: 8.6 cmh20   SLEEP STUDIES:  HST (02/13/23) AHI 69, RDI 75, min SPO2 83%   LABS: No results found  for this or any previous visit (from the past 2160 hour(s)).  Radiology: MR Brain W Wo Contrast  Result Date: 12/08/2015 CLINICAL DATA:  Posterior headache with intense pressure for 2 weeks. EXAM: MRI HEAD WITHOUT AND WITH CONTRAST TECHNIQUE: Multiplanar, multiecho pulse sequences of the brain and surrounding structures  were obtained without and with intravenous contrast. CONTRAST:  20mL MULTIHANCE GADOBENATE DIMEGLUMINE 529 MG/ML IV SOLN COMPARISON:  None. FINDINGS: There is no evidence of acute infarct, intracranial hemorrhage, mass, midline shift, or extra-axial fluid collection. Ventricles and sulci are normal for age. The brain is normal in signal. A tiny developmental venous anomaly is noted in the anterior parasagittal right frontal lobe. No abnormal enhancement is seen elsewhere. Orbits are unremarkable. Mild paranasal sinus mucosal thickening is noted, greatest in the left maxillary sinus. The mastoid air cells are clear. Major intracranial vascular flow voids are preserved. IMPRESSION: No acute or significant intracranial abnormality. Electronically Signed   By: Sebastian Ache M.D.   On: 12/08/2015 09:39    No results found.  No results found.    Assessment and Plan: Patient Active Problem List   Diagnosis Date Noted   CPAP use counseling 08/13/2023   Hypertriglyceridemia 08/08/2019   Essential hypertension 08/06/2019   Primary osteoarthritis involving multiple joints 08/06/2019   OSA (obstructive sleep apnea) 11/09/2015    1. OSA (obstructive sleep apnea) Pt has severe sleep apnea. His apnea is controlled on his cpap 5-20 but he is not tolerating the pressure. Will try fixed pressure at 7 with a 2 week download.  CPAP is medically necessary to treat this patient's OSA.  F/u 54m  2. CPAP use counseling CPAP Counseling: had a lengthy discussion with the patient regarding the importance of PAP therapy in management of the sleep apnea. Patient appears to understand the risk factor  reduction and also understands the risks associated with untreated sleep apnea. Patient will try to make a good faith effort to remain compliant with therapy. Also instructed the patient on proper cleaning of the device including the water must be changed daily if possible and use of distilled water is preferred. Patient understands that the machine should be regularly cleaned with appropriate recommended cleaning solutions that do not damage the PAP machine for example given white vinegar and water rinses. Other methods such as ozone treatment may not be as good as these simple methods to achieve cleaning.      General Counseling: I have discussed the findings of the evaluation and examination with William Clark.  I have also discussed any further diagnostic evaluation thatmay be needed or ordered today. William Clark verbalizes understanding of the findings of todays visit. We also reviewed his medications today and discussed drug interactions and side effects including but not limited excessive drowsiness and altered mental states. We also discussed that there is always a risk not just to him but also people around him. he has been encouraged to call the office with any questions or concerns that should arise related to todays visit.  No orders of the defined types were placed in this encounter.       I have personally obtained a history, evaluated the patient, evaluated pertinent data, formulated the assessment and plan and placed orders.   This patient was seen today by Emmaline Kluver, PA-C in collaboration with Dr. Freda Munro.   William Pax, MD [facility] Diplomate ABMS Pulmonary and Critical Care Medicine Sleep medicine

## 2023-08-13 NOTE — Patient Instructions (Signed)

## 2023-10-11 ENCOUNTER — Other Ambulatory Visit: Payer: Self-pay | Admitting: Physical Medicine & Rehabilitation

## 2023-10-11 DIAGNOSIS — G8929 Other chronic pain: Secondary | ICD-10-CM

## 2023-10-26 ENCOUNTER — Ambulatory Visit
Admission: RE | Admit: 2023-10-26 | Discharge: 2023-10-26 | Disposition: A | Discharge status: Home / Self Care | Payer: Medicare Other | Source: Ambulatory Visit | Attending: Physical Medicine & Rehabilitation | Admitting: Physical Medicine & Rehabilitation

## 2023-10-26 DIAGNOSIS — G8929 Other chronic pain: Secondary | ICD-10-CM

## 2023-12-02 NOTE — Progress Notes (Signed)
 [facility] [address] [place], [place] [address]  Pulmonary Sleep Medicine   Office Visit Note  Patient Name: William Clark DOB: [birth year] MRN [id number]    Chief Complaint: Obstructive Sleep Apnea visit  Brief History:  William Clark is seen today for a follow up after set up on CPAP@7cmh20 .  The patient has a 9 month history of sleep apnea. Patient is using PAP nightly.  The patient feels rested after sleeping with PAP.  The patient reports benefit from PAP use. Reported sleepiness is  improved and the Epworth Sleepiness Score is 3 out of 24. The patient is not take naps. The patient complains of the following: no major complaints with therapy.   The compliance download shows 70% compliance with an average use time of 4 hr 52 min hours. The AHI is 3.1  The patient does not complain of limb movements disrupting sleep. Patient reports recently having congestion from a cold that kept him from using his machine. He also reports that he never sleeps more than 5 hrs a night. He goes to bed at 1:00am-2:00am and wakes at 7:00am  ROS  General: (-) fever, (-) chills, (-) night sweat Nose and Sinuses: (-) nasal stuffiness or itchiness, (-) postnasal drip, (-) nosebleeds, (-) sinus trouble. Mouth and Throat: (-) sore throat, (-) hoarseness. Neck: (-) swollen glands, (-) enlarged thyroid, (-) neck pain. Respiratory: - cough, - shortness of breath, - wheezing. Neurologic: + numbness, + tingling. Psychiatric: + anxiety, - depression   Current Medication: Outpatient Encounter Medications as of 12/03/2023  Medication Sig   magnesium 30 MG tablet Take 30 mg by mouth 2 (two) times daily.   Omega 3-6-9 Fatty Acids (OMEGA 3-6-9 PO) Take by mouth.   diazepam (VALIUM) 5 MG tablet Take 5 mg by mouth every 6 (six) hours as needed for muscle spasms.   losartan (COZAAR) 50 MG tablet Take 1 tablet by mouth daily.   rizatriptan (MAXALT-MLT) 10 MG disintegrating tablet Take 5 mg by mouth as  needed for migraine. May repeat in 2 hours if needed   traMADol (ULTRAM) 50 MG tablet Take 50 mg by mouth every 6 (six) hours as needed for moderate pain.   No facility-administered encounter medications on file as of 12/03/2023.    Surgical History: Past Surgical History:  Procedure Laterality Date   BACK SURGERY      Medical History: Past Medical History:  Diagnosis Date   Headache, cluster    Hypertension    Migraine     Family History: Non contributory to the present illness  Social History: Social History   Socioeconomic History   Marital status: Married    Spouse name: Not on file   Number of children: Not on file   Years of education: Not on file   Highest education level: Not on file  Occupational History   Not on file  Tobacco Use   Smoking status: Never   Smokeless tobacco: Never  Substance and Sexual Activity   Alcohol use: No   Drug use: No   Sexual activity: Not on file  Other Topics Concern   Not on file  Social History Narrative   Not on file   Social Drivers of Health   Financial Resource Strain: Low Risk  (10/08/2023)   Received from [facility]   Overall Financial Resource Strain (CARDIA)    Difficulty of Paying Living Expenses: Not very hard  Food Insecurity: Food Insecurity Present (10/08/2023)   Received from [facility]   Hunger Vital Sign    Worried About Running Out of Food in the Last Year: Never true    Ran Out of Food in the Last Year: Sometimes true  Transportation Needs: No Transportation Needs (10/08/2023)   Received from [facility] - Transportation    In the past 12 months, has lack of transportation kept you from medical appointments or from getting medications?: No    Lack of Transportation (Non-Medical): No  Physical Activity: Not on file  Stress: Not on file  Social Connections: Not on file  Intimate Partner Violence: Not on file    Vital Signs: Blood  pressure (!) 135/93, pulse 81, resp. rate 18, height 6' (1.829 m), weight 234 lb 4.8 oz (106.3 kg), SpO2 97%. Body mass index is 31.78 kg/m.    Examination: General Appearance: The patient is well-developed, well-nourished, and in no distress. Neck Circumference: 46cm Skin: Gross inspection of skin unremarkable. Head: normocephalic, no gross deformities. Eyes: no gross deformities noted. ENT: ears appear grossly normal Neurologic: Alert and oriented. No involuntary movements.  STOP BANG RISK ASSESSMENT S (snore) Have you been told that you snore?     No   T (tired) Are you often tired, fatigued, or sleepy during the day?   NO  O (obstruction) Do you stop breathing, choke, or gasp during sleep? NO   P (pressure) Do you have or are you being treated for high blood pressure? YES   B (BMI) Is your body index greater than 35 kg/m? NO   A (age) Are you 41 years old or older? YES   N (neck) Do you have a neck circumference greater than 16 inches?   YES   G (gender) Are you a male? YES   TOTAL STOP/BANG "YES" ANSWERS 4       A STOP-Bang score of 2 or less is considered low risk, and a score of 5 or more is high risk for having either moderate or severe OSA. For people who score 3 or 4, doctors may need to perform further assessment to determine how likely they are to have OSA.         EPWORTH SLEEPINESS SCALE:  Scale:  (0)= no chance of dozing; (1)= slight chance of dozing; (2)= moderate chance of dozing; (3)= high chance of dozing  Chance  Situtation    Sitting and reading: 0    Watching TV: 2    Sitting Inactive in public: 0    As a passenger in car: 1      Lying down to rest: 0    Sitting and talking: 0    Sitting quielty after lunch: 0    In a car, stopped in traffic: 0   TOTAL SCORE:   3 out of 24    SLEEP STUDIES:  HST - 02/13/23 - AHI 69, RDI 75/hr, min Sp02 83%   CPAP COMPLIANCE DATA:  Date Range: 11/02/2023-12/01/2023  Average Daily Use: 4 hours  52 min  Median Use: 5.5 hrs  Compliance for > 4 Hours: 70% days  AHI: 3.1 respiratory events per hour  Days Used: 28/30  Mask Leak: 11.7  95th Percentile Pressure: 7 cmh20         LABS: No results found for this or any previous visit (from the past 2160 hours).  Radiology: MR LUMBAR SPINE WO CONTRAST Result Date: 11/06/2023 CLINICAL DATA:  Low back pain radiating into the right hip for 6 months.  History of L3-4 lumbar fusion. EXAM: MRI LUMBAR SPINE WITHOUT CONTRAST TECHNIQUE: Multiplanar, multisequence MR imaging of the lumbar spine was performed. No intravenous contrast was administered. COMPARISON:  Intraoperative radiographs 12/05/2007. Lumbar MRI 05/23/2007. Recent outside lumbar spine radiographs are not available for direct comparison. FINDINGS: Segmentation: Conventional anatomy assumed, with the last open disc space designated L5-S1. Alignment:  Grade 1 retrolisthesis at L2-3. Vertebrae: No worrisome osseous lesion, acute fracture or pars defect. Patient has undergone interval posterior decompression and PLIF at L4-5. Interbody fusion appears solid. The visualized sacroiliac joints appear unremarkable. Conus medullaris: Extends to the L1 level. The conus and cauda equina appear normal. Paraspinal and other soft tissues: No significant paraspinal findings. There is possible circumferential sigmoid colon wall thickening on the sagittal images, not included on the axial images. Disc levels: Sagittal images demonstrate no significant disc space findings within the visualized lower thoracic spine. L1-2: Interval moderate loss of disc height with annular disc bulging and endplate osteophytes. Mild facet and ligamentous hypertrophy. Borderline spinal stenosis without foraminal narrowing or definite nerve root encroachment. L2-3: Interval moderate loss of disc height with annular disc bulging and endplate osteophytes. Moderate facet and ligamentous hypertrophy. Resulting mild spinal  stenosis with mild asymmetric narrowing of the left lateral recess. The foramina appear sufficiently patent. L3-4: Interval moderate loss of disc height with annular disc bulging and endplate osteophytes asymmetric to the left. Moderate facet and ligamentous hypertrophy. Resulting moderate to severe multifactorial spinal stenosis with lateral recess narrowing bilaterally. There is mild narrowing of both foramina. L4-5: Interval laminectomy and PLIF. The spinal canal and neural foramina are widely decompressed. L5-S1: Relatively preserved disc height with mildly progressive disc bulging and a broad-based central disc protrusion. Moderate facet and ligamentous hypertrophy. Mild spinal stenosis with mild lateral recess and foraminal narrowing bilaterally. IMPRESSION: 1. Interval posterior decompression and PLIF at L4-5 with widely decompressed spinal canal and neural foramina. 2. 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 3. Mild multifactorial spinal stenosis at L2-3 and L5-S1. 4. Possible circumferential sigmoid colon wall thickening on the sagittal images, not included on the axial images. Correlate clinically for possible colitis. This could be further evaluated with CT if clinically warranted. Electronically Signed   By: Carey Bullocks M.D.   On: 11/06/2023 17:26    No results found.  No results found.    Assessment and Plan: Patient Active Problem List   Diagnosis Date Noted   CPAP use counseling 08/13/2023   Hypertriglyceridemia 08/08/2019   Essential hypertension 08/06/2019   Primary osteoarthritis involving multiple joints 08/06/2019   OSA (obstructive sleep apnea) 11/09/2015   1. OSA (obstructive sleep apnea) (Primary)  The patient does  tolerate PAP and reports  benefit from PAP use. The patient was reminded how to clean equipment and advised to replace supplies routinely. The patient was  also counselled on weight loss. The compliance is fair. The AHI is 3.1.   OSA on cpap- controlled. Continue with  compliance with pap. CPAP continues to be medically necessary to treat this patient's OSA. F/u one year.   2. CPAP use counseling CPAP Counseling: had a lengthy discussion with the patient regarding the importance of PAP therapy in management of the sleep apnea. Patient appears to understand the risk factor reduction and also understands the risks associated with untreated sleep apnea. Patient will try to make a good faith effort to remain compliant with therapy. Also instructed the patient on proper cleaning  of the device including the water must be changed daily if possible and use of distilled water is preferred. Patient understands that the machine should be regularly cleaned with appropriate recommended cleaning solutions that do not damage the PAP machine for example given white vinegar and water rinses. Other methods such as ozone treatment may not be as good as these simple methods to achieve cleaning.      General Counseling: I have discussed the findings of the evaluation and examination with Shaheed.  I have also discussed any further diagnostic evaluation thatmay be needed or ordered today. Koy verbalizes understanding of the findings of todays visit. We also reviewed his medications today and discussed drug interactions and side effects including but not limited excessive drowsiness and altered mental states. We also discussed that there is always a risk not just to him but also people around him. he has been encouraged to call the office with any questions or concerns that should arise related to todays visit.  No orders of the defined types were placed in this encounter.       I have personally obtained a history, examined the patient, evaluated laboratory and imaging results, formulated the assessment and plan and placed orders. This patient was seen today by Emmaline Kluver, PA-C in collaboration with Dr. Freda Munro.   Yevonne Pax, MD [facility] Diplomate ABMS Pulmonary Critical Care Medicine and Sleep Medicine

## 2023-12-03 ENCOUNTER — Ambulatory Visit (INDEPENDENT_AMBULATORY_CARE_PROVIDER_SITE_OTHER): Payer: Medicare Other | Admitting: Internal Medicine

## 2023-12-03 VITALS — BP 135/93 | HR 81 | Resp 18 | Ht 72.0 in | Wt 234.3 lb

## 2023-12-03 DIAGNOSIS — Z7189 Other specified counseling: Secondary | ICD-10-CM

## 2023-12-03 DIAGNOSIS — G4733 Obstructive sleep apnea (adult) (pediatric): Secondary | ICD-10-CM

## 2023-12-03 NOTE — Patient Instructions (Signed)

## 2024-09-30 ENCOUNTER — Other Ambulatory Visit: Payer: Self-pay | Admitting: Gerontology

## 2024-09-30 DIAGNOSIS — G8929 Other chronic pain: Secondary | ICD-10-CM

## 2024-10-14 ENCOUNTER — Ambulatory Visit
Admission: RE | Admit: 2024-10-14 | Discharge: 2024-10-14 | Disposition: A | Discharge status: Home / Self Care | Source: Ambulatory Visit | Attending: Gerontology | Admitting: Gerontology

## 2024-10-14 DIAGNOSIS — M545 Low back pain, unspecified: Secondary | ICD-10-CM

## 2024-10-14 MED ORDER — GADOPICLENOL 0.5 MMOL/ML IV SOLN
10.0000 mL | Freq: Once | INTRAVENOUS | Status: AC | PRN
  Administered 2024-10-14: 10 mL via INTRAVENOUS

## 2024-11-18 ENCOUNTER — Other Ambulatory Visit: Payer: Self-pay | Admitting: Physical Medicine & Rehabilitation

## 2024-11-18 DIAGNOSIS — M542 Cervicalgia: Secondary | ICD-10-CM

## 2024-11-26 ENCOUNTER — Other Ambulatory Visit
